# Patient Record
Sex: Female | Born: 1945 | Race: White | Hispanic: No | Smoking: Never smoker
Health system: Southern US, Community
[De-identification: ages and names within clinical notes are randomized; demographics above are authoritative.]

## PROBLEM LIST (undated history)

## (undated) DIAGNOSIS — E079 Disorder of thyroid, unspecified: Secondary | ICD-10-CM

## (undated) DIAGNOSIS — D649 Anemia, unspecified: Secondary | ICD-10-CM

## (undated) HISTORY — DX: Anemia, unspecified: D64.9

## (undated) HISTORY — DX: Disorder of thyroid, unspecified: E07.9

---

## 2015-08-26 ENCOUNTER — Ambulatory Visit (INDEPENDENT_AMBULATORY_CARE_PROVIDER_SITE_OTHER): Payer: BC Managed Care – PPO

## 2015-08-26 ENCOUNTER — Ambulatory Visit (INDEPENDENT_AMBULATORY_CARE_PROVIDER_SITE_OTHER): Payer: BC Managed Care – PPO | Admitting: Physician Assistant

## 2015-08-26 VITALS — BP 126/54 | HR 79 | Temp 98.2°F | Resp 16 | Ht 64.0 in | Wt 123.6 lb

## 2015-08-26 DIAGNOSIS — S52502A Unspecified fracture of the lower end of left radius, initial encounter for closed fracture: Secondary | ICD-10-CM

## 2015-08-26 DIAGNOSIS — M25532 Pain in left wrist: Secondary | ICD-10-CM

## 2015-08-26 DIAGNOSIS — Z23 Encounter for immunization: Secondary | ICD-10-CM

## 2015-08-26 MED ORDER — MELOXICAM 15 MG PO TABS: 15.0000 mg | ORAL_TABLET | Freq: Every day | ORAL | Status: AC

## 2015-08-26 NOTE — Progress Notes (Signed)
Subjective:    Patient ID: Alexis Shaffer, female    DOB: 1945/11/01, 69 y.o.   MRN: [id number]  Chief Complaint  Patient presents with  . Wrist Pain    Left, fell yesterday  . Flu Vaccine   HPI Patient presents today for evaluation of left wrist pain after falling onto an outstretched hand yesterday.   Patient is right-handed. Was moving boxes in her house when she lost her footing, tripped, and fell. Initially had swelling and pain all over her wrist, but this has improved over the last 24 hours with ice and ibuprofen. Keeping it wrapped. Still able to grip and hold objects, but very sore. Worse with any type of movement. She denies any numbness/tingling in her hand or fingers.    Fractured her left wrist 10 years ago, was poorly re-set and has had a radial deviation deformity since then.   Patient would also like to get her annual flu vaccination today.   Review of Systems  Constitutional: Negative for fever and chills.  Musculoskeletal: Positive for joint swelling (left wrist and hand) and arthralgias (left wrist pain).  Skin: Positive for color change (mild bruising over left wrist).   There are no active problems to display for this patient.  History reviewed. No pertinent family history.  Social History   Social History  . Marital Status: Widowed    Spouse Name: N/A  . Number of Children: N/A  . Years of Education: N/A   Occupational History  . Not on file.   Social History Main Topics  . Smoking status: Never Smoker   . Smokeless tobacco: Not on file  . Alcohol Use: 0.0 oz/week    0 Standard drinks or equivalent per week  . Drug Use: No  . Sexual Activity: Not on file   Other Topics Concern  . Not on file   Social History Narrative   Works as Technical sales engineer at [employer].    Prior to Admission medications   Medication Sig Start Date End Date Taking? Authorizing Provider  cetirizine (ZYRTEC) 10 MG tablet Take 10 mg by mouth daily.   Yes Historical Provider, MD    hydrochlorothiazide (HYDRODIURIL) 25 MG tablet Take 25 mg by mouth daily.   Yes Historical Provider, MD  lisinopril (PRINIVIL,ZESTRIL) 20 MG tablet Take 20 mg by mouth daily.   Yes Historical Provider, MD   No Known Allergies     Objective:   Physical Exam  Constitutional: She is oriented to person, place, and time. She appears well-developed and well-nourished. No distress.  HENT:  Head: Normocephalic and atraumatic.  Eyes: EOM are normal. No scleral icterus.  Neurological: She is alert and oriented to person, place, and time.  Skin: She is not diaphoretic.  Mild bruising at distal left forearm. Wrist is tender to palpation. Pain elicited with passive ROM. No crepitus. Edema noted in digits. No lesions noted.   Psychiatric: She has a normal mood and affect. Her behavior is normal. Judgment and thought content normal.   BP 126/54 mmHg  Pulse 79  Temp(Src) 98.2 F (36.8 C) (Oral)  Resp 16  Ht  (1.626 m)  Wt 123 lb 9.6 oz (56.065 kg)  BMI 21.21 kg/m2  SpO2 96%  2-view left wrist x-ray shows impacted radial head fracture and old ulnar styloid fracture. No carpal bone deformities.     Assessment & Plan:  1. Wrist pain, acute, left - Closed fracture of distal radius. Sugar tong splint and sling applied. Meloxicam prescribed  for pain control.  - DG Wrist Complete Left; Future  2. Need for influenza vaccination - Flu Vaccine QUAD 36+ mos IM  3. Fracture of distal end of radius, left, closed, initial encounter - Ambulatory referral to Orthopedic Surgery - meloxicam (MOBIC) 15 MG tablet; Take 1 tablet (15 mg total) by mouth daily.  Dispense: 30 tablet; Refill: 1

## 2015-08-26 NOTE — Progress Notes (Signed)
Subjective:   Patient ID: Alexis Shaffer, female     DOB: 04-05-1946, 69 y.o.    MRN: [id number]  PCP: No primary care provider on file.  Chief Complaint  Patient presents with  . Wrist Pain    Left, fell yesterday  . Flu Vaccine    HPI  Presents for evaluation of left wrist pain after falling onto an outstretched hand yesterday.   Patient is right-handed.   Was moving boxes in her house when she lost her footing, tripped, and fell. Initially had swelling and pain all over her wrist, but this has improved over the last 24 hours with ice and ibuprofen. Keeping it wrapped. Still able to grip and hold objects, but very sore. Worse with any type of movement. She denies any numbness/tingling in her hand or fingers.   Fractured her left wrist 10 years ago, was poorly re-set and has had a radial deviation deformity since then.   Patient would also like to get her annual flu vaccination today.    Prior to Admission medications   Medication Sig Start Date End Date Taking? Authorizing Provider  cetirizine (ZYRTEC) 10 MG tablet Take 10 mg by mouth daily.   Yes Historical Provider, MD  hydrochlorothiazide (HYDRODIURIL) 25 MG tablet Take 25 mg by mouth daily.   Yes Historical Provider, MD  lisinopril (PRINIVIL,ZESTRIL) 20 MG tablet Take 20 mg by mouth daily.   Yes Historical Provider, MD     No Known Allergies   There are no active problems to display for this patient.    History reviewed. No pertinent family history.   Social History   Social History  . Marital Status: Widowed    Spouse Name: N/A  . Number of Children: N/A  . Years of Education: N/A   Occupational History  . Not on file.   Social History Main Topics  . Smoking status: Never Smoker   . Smokeless tobacco: Not on file  . Alcohol Use: 0.0 oz/week    0 Standard drinks or equivalent per week  . Drug Use: No  . Sexual Activity: Not on file   Other Topics Concern  . Not on file   Social History Narrative    . No narrative on file        Review of Systems  Constitutional: Negative.   Musculoskeletal: Positive for joint swelling and arthralgias. Negative for back pain and gait problem.  Neurological: Negative for dizziness, weakness, light-headedness and numbness.         Objective:  Physical Exam  Constitutional: She is oriented to person, place, and time. She appears well-developed and well-nourished. She is active and cooperative. No distress.  BP 126/54 mmHg  Pulse 79  Temp(Src) 98.2 F (36.8 C) (Oral)  Resp 16  Ht  (1.626 m)  Wt 123 lb 9.6 oz (56.065 kg)  BMI 21.21 kg/m2  SpO2 96%   Eyes: Conjunctivae are normal.  Pulmonary/Chest: Effort normal.  Musculoskeletal:       Left wrist: She exhibits decreased range of motion, tenderness, bony tenderness, swelling and deformity (mild radial angulation of the wrist is baseline).       Left hand: Normal sensation noted. Decreased strength (due to pain in the wrist) noted.  Neurological: She is alert and oriented to person, place, and time.  Psychiatric: She has a normal mood and affect. Her speech is normal and behavior is normal.    LEFT Wrist: [facility] reading (PRIMARY) by  Dr. Merla Riches. Apparent old ulnar  styloid fracture. Acute fracture of the distal radius without angulation.           Assessment & Plan:  1. Wrist pain, acute, left - DG Wrist Complete Left; Future  2. Need for influenza vaccination - Flu Vaccine QUAD 36+ mos IM  3. Fracture of distal end of radius, left, closed, initial encounter Sugar tong splint applied. Sling. Declines narcotic pain medication. Referral to orthopedics. Anticipatory guidance provided. - Ambulatory referral to Orthopedic Surgery - meloxicam (MOBIC) 15 MG tablet; Take 1 tablet (15 mg total) by mouth daily.  Dispense: 30 tablet; Refill: 1   Fernande Brashelle S. Keygan Dumond, PA-C Physician Assistant-Certified [facility]

## 2015-11-23 ENCOUNTER — Other Ambulatory Visit: Payer: Self-pay | Admitting: Family Medicine

## 2015-11-23 DIAGNOSIS — R7989 Other specified abnormal findings of blood chemistry: Secondary | ICD-10-CM

## 2015-11-29 ENCOUNTER — Other Ambulatory Visit: Payer: Self-pay

## 2015-11-30 ENCOUNTER — Other Ambulatory Visit: Payer: Self-pay

## 2015-12-11 ENCOUNTER — Other Ambulatory Visit: Payer: Self-pay

## 2015-12-11 DIAGNOSIS — I83893 Varicose veins of bilateral lower extremities with other complications: Secondary | ICD-10-CM

## 2016-02-27 ENCOUNTER — Encounter: Payer: Self-pay | Admitting: Vascular Surgery

## 2016-03-04 ENCOUNTER — Encounter: Payer: Self-pay | Admitting: Vascular Surgery

## 2016-03-04 ENCOUNTER — Encounter (HOSPITAL_COMMUNITY): Payer: Self-pay

## 2016-05-15 IMAGING — CR DG WRIST COMPLETE 3+V*L*
2 series · 2 of 2 positions shown · non-contrast
Comparison: None.

CLINICAL DATA: Fall with left wrist pain.

EXAM:
LEFT WRIST - COMPLETE 3+ VIEW

[PA]
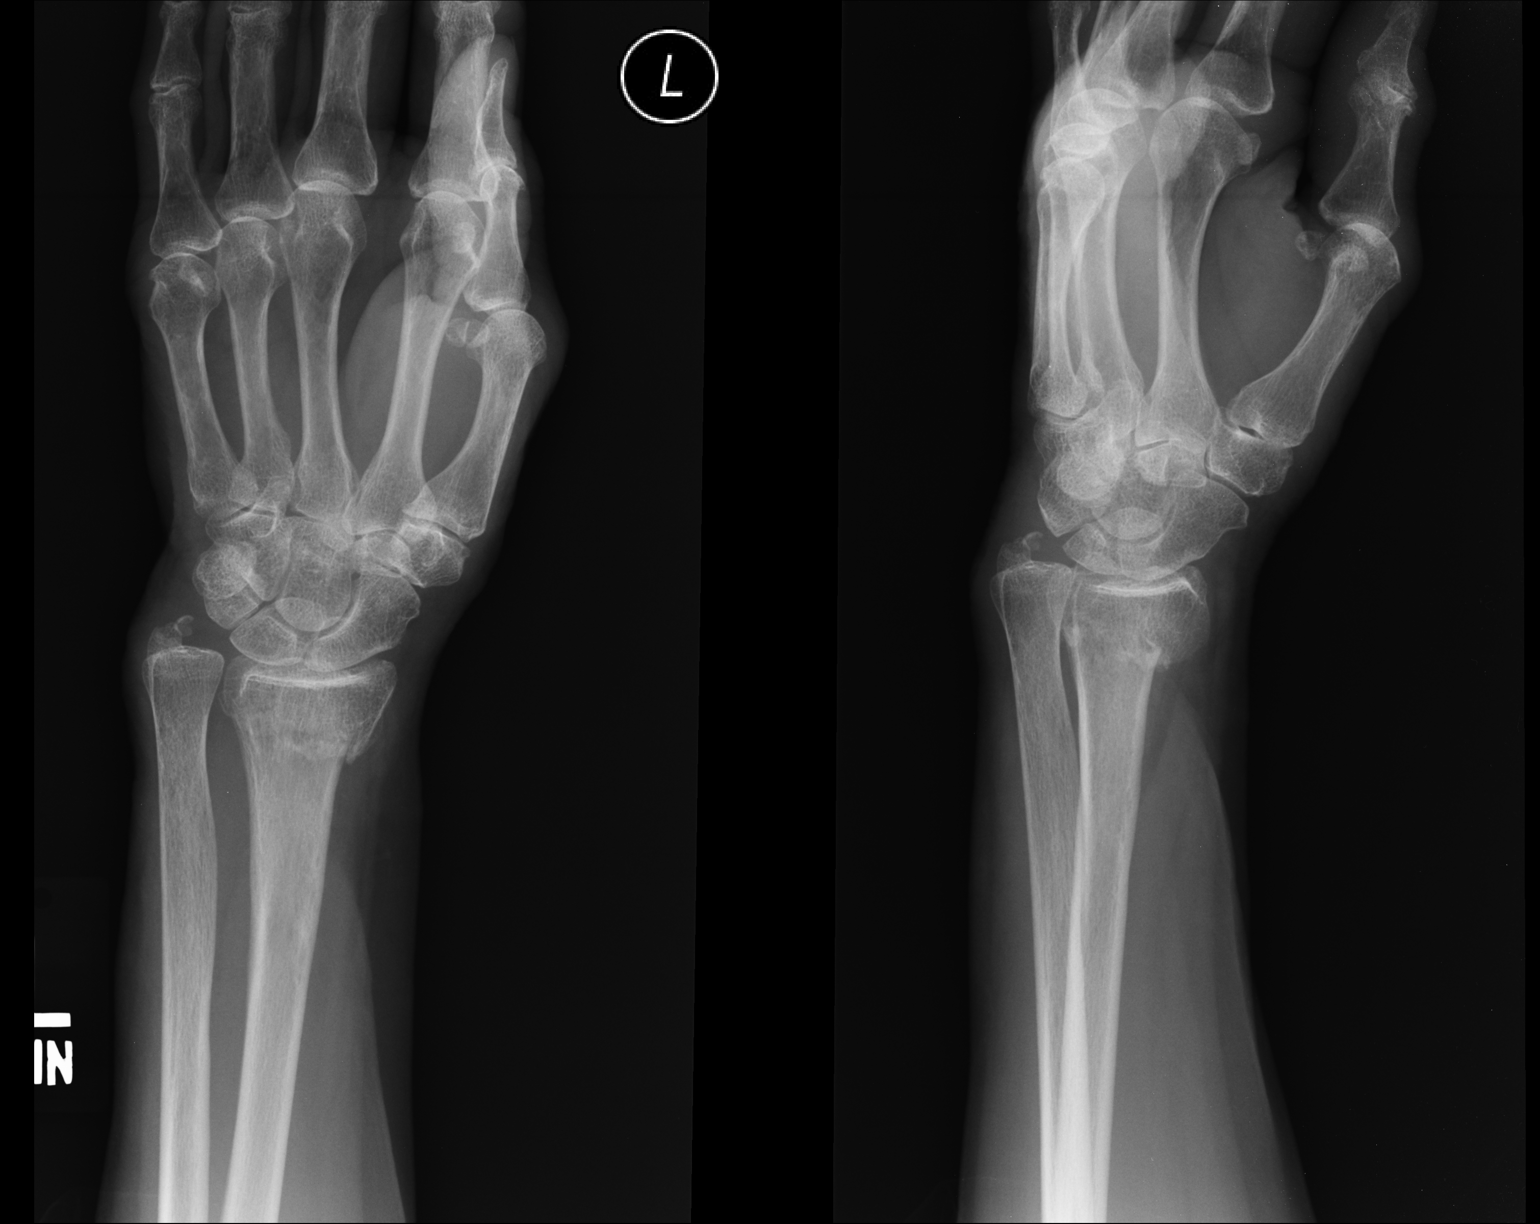

[lateral]
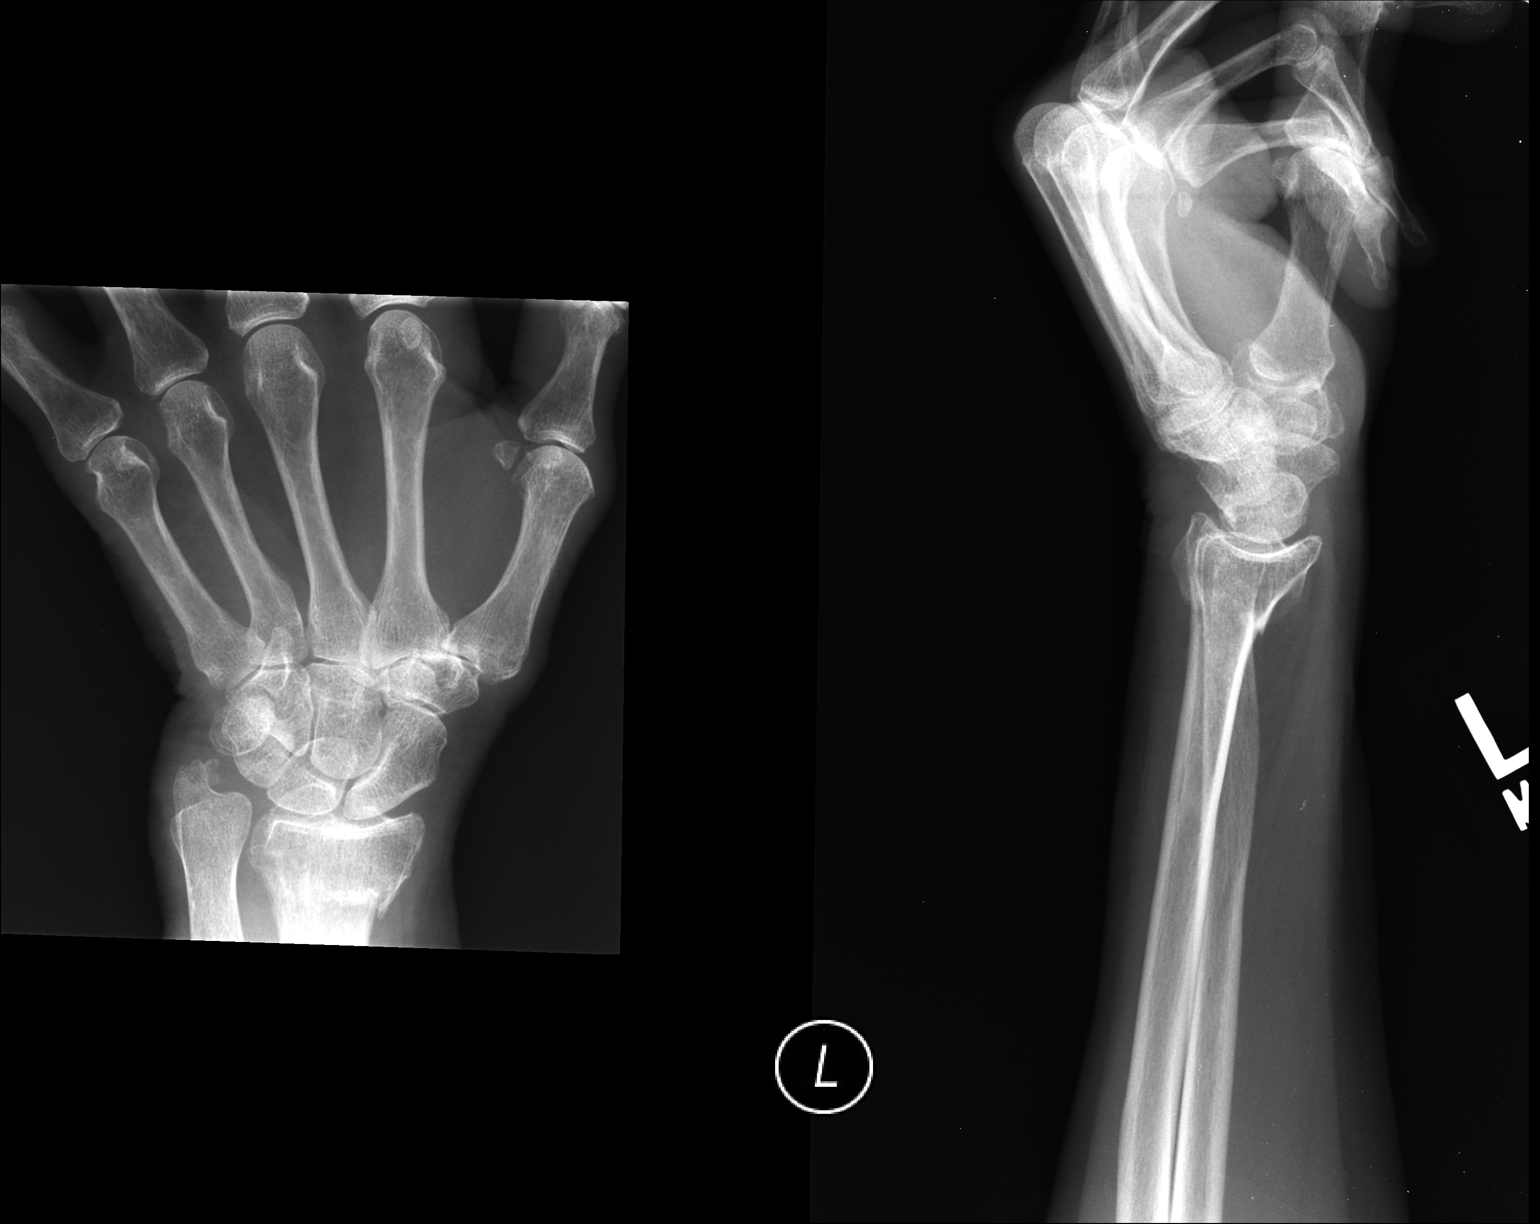

[2 of 2 positions shown; findings below may reference images not displayed]

FINDINGS: There is a comminuted mildly impacted transverse fracture of the
distal meta epiphysis in the right radius, without appreciable
displacement or angulation. There is a mildly displaced left ulnar
styloid fracture with 3 mm radial displacement of the dominant
distal fracture fragment. There is soft tissue swelling surrounding
both fracture sites. No additional fracture. No dislocation or
suspicious focal osseous lesion. Mild osteoarthritis at the first
carpometacarpal joint.
IMPRESSION: 1. Nondisplaced, mildly impacted and comminuted transverse left
distal radius fracture.
2. Mildly displaced left ulnar styloid fracture.
